# Patient Record
Sex: Female | Born: 1995 | Race: White | Hispanic: No | Marital: Single | Smoking: Never smoker
Health system: Southern US, Community
[De-identification: ages and names within clinical notes are randomized; demographics above are authoritative.]

## PROBLEM LIST (undated history)

## (undated) DIAGNOSIS — N926 Irregular menstruation, unspecified: Secondary | ICD-10-CM

## (undated) DIAGNOSIS — F419 Anxiety disorder, unspecified: Secondary | ICD-10-CM

## (undated) DIAGNOSIS — F329 Major depressive disorder, single episode, unspecified: Secondary | ICD-10-CM

## (undated) DIAGNOSIS — M5126 Other intervertebral disc displacement, lumbar region: Secondary | ICD-10-CM

## (undated) DIAGNOSIS — I10 Essential (primary) hypertension: Secondary | ICD-10-CM

## (undated) DIAGNOSIS — J45909 Unspecified asthma, uncomplicated: Secondary | ICD-10-CM

## (undated) DIAGNOSIS — E282 Polycystic ovarian syndrome: Secondary | ICD-10-CM

## (undated) HISTORY — DX: Anxiety disorder, unspecified: F41.9

## (undated) HISTORY — DX: Irregular menstruation, unspecified: N92.6

## (undated) HISTORY — DX: Essential (primary) hypertension: I10

## (undated) HISTORY — DX: Polycystic ovarian syndrome: E28.2

## (undated) HISTORY — DX: Major depressive disorder, single episode, unspecified: F32.9

## (undated) HISTORY — DX: Other intervertebral disc displacement, lumbar region: M51.26

## (undated) HISTORY — PX: TONSILLECTOMY AND ADENOIDECTOMY: SUR1326

## (undated) HISTORY — DX: Unspecified asthma, uncomplicated: J45.909

---

## 2017-06-22 DIAGNOSIS — I10 Essential (primary) hypertension: Secondary | ICD-10-CM

## 2017-06-22 DIAGNOSIS — R0789 Other chest pain: Secondary | ICD-10-CM | POA: Insufficient documentation

## 2017-06-22 DIAGNOSIS — R000 Tachycardia, unspecified: Secondary | ICD-10-CM

## 2017-06-22 HISTORY — DX: Other chest pain: R07.89

## 2017-06-22 HISTORY — DX: Essential (primary) hypertension: I10

## 2017-06-22 HISTORY — DX: Tachycardia, unspecified: R00.0

## 2017-06-27 ENCOUNTER — Ambulatory Visit: Payer: BLUE CROSS/BLUE SHIELD | Admitting: Cardiology

## 2017-06-27 ENCOUNTER — Encounter: Payer: Self-pay | Admitting: *Deleted

## 2017-06-27 ENCOUNTER — Encounter: Payer: Self-pay | Admitting: Cardiology

## 2017-06-27 DIAGNOSIS — R0789 Other chest pain: Secondary | ICD-10-CM | POA: Insufficient documentation

## 2017-06-27 DIAGNOSIS — E785 Hyperlipidemia, unspecified: Secondary | ICD-10-CM

## 2017-06-27 DIAGNOSIS — R002 Palpitations: Secondary | ICD-10-CM

## 2017-06-27 HISTORY — DX: Other chest pain: R07.89

## 2017-06-27 HISTORY — DX: Palpitations: R00.2

## 2017-06-27 HISTORY — DX: Hyperlipidemia, unspecified: E78.5

## 2017-06-27 NOTE — Progress Notes (Signed)
Cardiology Consultation:    Date:  06/27/2017   ID:  Tamara Alvarez, DOB Feb 14, 1996, MRN [id number]  PCP:  Shelbie Ammons, MD  Cardiologist:  Gypsy Balsam, MD   Referring MD: Shelbie Ammons, MD   Chief Complaint  Patient presents with  . New Patient (Initial Visit)    per RH   Palpitations  History of Present Illness:    Tamara Alvarez is a 22 y.o. female who is being seen today for the evaluation of palpitations at the request of Haque, Imran P, MD.  For many years she experienced palpitations that she describes a sensation of her heart speeding up it happens typically when she is standing up.  She tells me that onset and offset of the sensation is typically abrupt.  She eventually ended up going to the emergency room just few days ago when she started feeling lousy her heart started spitting up she check her blood pressure was slightly elevated her heart rate was about 140 she went to the emergency room nothing critical was fine in the emergency room she was given small dose of beta-blocker and she was sent home since that time she is feeling better seems like beta-blocker helping somewhat.  She said almost since she remember she got those episodes of her heart spitting up.  When she got the sensation she also described to have stabbing-like sensation in her chest also some shortness of breath but no sweating.  There is no dizziness no passing out.  She try to exercise on the regular basis does have a treadmill at home and try to use it a few times a week she can walk up to 20 minutes maybe half an hour with no difficulties.  She also has a dog that she likes and she walks with her with no difficulties.  She works in [employer] however she was getting those palpitations often while walking that she decided to stay away from work.  Past Medical History:  Diagnosis Date  . Asthma   . Hypertension 06/22/2017      Current Medications: Current Meds  Medication Sig  .  amoxicillin-clavulanate (AUGMENTIN) 875-125 MG tablet Take 1 tablet by mouth 2 (two) times daily.  Marland Kitchen etonogestrel (NEXPLANON) 68 MG IMPL implant 68 mg by Subdermal route once.  . loratadine (CLARITIN) 10 MG tablet Take 10 mg by mouth daily.  . metoprolol tartrate (LOPRESSOR) 25 MG tablet Take 25 mg by mouth 2 (two) times daily.   . Multiple Vitamin (MULTIVITAMIN) tablet Take 1 tablet by mouth daily.  . Tretinoin, Facial Wrinkles, (TRETINOIN, EMOLLIENT,) 0.05 % CREA Apply 1 application topically daily.     Allergies:   Hydrocodone; Other; and Doxycycline   Social History   Socioeconomic History  . Marital status: Unknown    Spouse name: Not on file  . Number of children: Not on file  . Years of education: Not on file  . Highest education level: Not on file  Occupational History  . Not on file  Social Needs  . Financial resource strain: Not on file  . Food insecurity:    Worry: Not on file    Inability: Not on file  . Transportation needs:    Medical: Not on file    Non-medical: Not on file  Tobacco Use  . Smoking status: Never Smoker  . Smokeless tobacco: Never Used  Substance and Sexual Activity  . Alcohol use: Yes    Comment: occasionally  . Drug use: Not  Currently  . Sexual activity: Not on file  Lifestyle  . Physical activity:    Days per week: Not on file    Minutes per session: Not on file  . Stress: Not on file  Relationships  . Social connections:    Talks on phone: Not on file    Gets together: Not on file    Attends religious service: Not on file    Active member of club or organization: Not on file    Attends meetings of clubs or organizations: Not on file    Relationship status: Not on file  Other Topics Concern  . Not on file  Social History Narrative  . Not on file     Family History: The patient's family history includes CAD in her maternal grandmother; Diabetes in her maternal grandmother; Heart Problems in her maternal uncle; Heart disease in  her paternal grandfather; Hypertension in her father. ROS:   Please see the history of present illness.    All 14 point review of systems negative except as described per history of present illness.  EKGs/Labs/Other Studies Reviewed:    The following studies were reviewed today: All laboratory tests reviewed from hospital all fine  EKG:  EKG is  ordered today.  The ekg ordered today demonstrates EKG done today in my office revealed normal sinus rhythm rate of 83 normal P interval normal QS complex duration morphology  Recent Labs: No results found for requested labs within last 8760 hours.  Recent Lipid Panel No results found for: CHOL, TRIG, HDL, CHOLHDL, VLDL, LDLCALC, LDLDIRECT  Physical Exam:    VS:  BP 112/90 (BP Location: Left Arm, Patient Position: Sitting, Cuff Size: Large)   Pulse 83   Ht  (1.727 m)   Wt 240 lb 12.8 oz (109.2 kg)   SpO2 99%   BMI 36.61 kg/m     Wt Readings from Last 3 Encounters:  06/27/17 240 lb 12.8 oz (109.2 kg)     GEN:  Well nourished, well developed in no acute distress HEENT: Normal NECK: No JVD; No carotid bruits LYMPHATICS: No lymphadenopathy CARDIAC: RRR, no murmurs, no rubs, no gallops RESPIRATORY:  Clear to auscultation without rales, wheezing or rhonchi  ABDOMEN: Soft, non-tender, non-distended MUSCULOSKELETAL:  No edema; No deformity  SKIN: Warm and dry NEUROLOGIC:  Alert and oriented x 3 PSYCHIATRIC:  Normal affect   ASSESSMENT:    1. Palpitations   2. Atypical chest pain   3. Dyslipidemia    PLAN:    In order of problems listed above:  1. Palpitations.  Some concerning features especially abrupt onset and abrupt offset.  I will ask her to wear event recorder to see exactly what kind of arrhythmia with dealing with.  Supportive evaluation I will do echocardiogram.  However honestly I doubt very much we will find some structural abnormality. 2. Atypical chest pain not related to exercise happening when she had  palpitations stabbing type.  I will not pursue any ischemia work-up at the moment however that something that will be reconsidered in the future if he truly finds somewhat arrhythmia. 3. Dyslipidemia: We will try to contact primary care physician to get her cholesterol to see if there is any need for medications.   Medication Adjustments/Labs and Tests Ordered: Current medicines are reviewed at length with the patient today.  Concerns regarding medicines are outlined above.  No orders of the defined types were placed in this encounter.  No orders of the defined types were  placed in this encounter.   Signed, Georgeanna Lea, MD, [facility]. 06/27/2017 11:23 AM    [facility]

## 2017-06-27 NOTE — Patient Instructions (Signed)
Medication Instructions:  Your physician recommends that you continue on your current medications as directed. Please refer to the Current Medication list given to you today.   Labwork: None  Testing/Procedures: You had an EKG today.   Your physician has requested that you have an echocardiogram. Echocardiography is a painless test that uses sound waves to create images of your heart. It provides your doctor with information about the size and shape of your heart and how well your heart's chambers and valves are working. This procedure takes approximately one hour. There are no restrictions for this procedure.  Your physician has recommended that you wear an event monitor. Event monitors are medical devices that record the heart's electrical activity. Doctors most often us these monitors to diagnose arrhythmias. Arrhythmias are problems with the speed or rhythm of the heartbeat. The monitor is a small, portable device. You can wear one while you do your normal daily activities. This is usually used to diagnose what is causing palpitations/syncope (passing out). Wear for 30 days.   Follow-Up: Your physician recommends that you schedule a follow-up appointment in: 6 weeks.  Any Other Special Instructions Will Be Listed Below (If Applicable).     If you need a refill on your cardiac medications before your next appointment, please call your pharmacy.   

## 2017-07-10 ENCOUNTER — Ambulatory Visit (HOSPITAL_BASED_OUTPATIENT_CLINIC_OR_DEPARTMENT_OTHER): Payer: BLUE CROSS/BLUE SHIELD | Attending: Cardiology

## 2017-07-16 ENCOUNTER — Encounter: Payer: Self-pay | Admitting: Cardiology

## 2017-08-20 ENCOUNTER — Ambulatory Visit: Payer: BLUE CROSS/BLUE SHIELD | Admitting: Cardiology

## 2020-05-03 ENCOUNTER — Other Ambulatory Visit (HOSPITAL_COMMUNITY): Payer: Self-pay | Admitting: Family Medicine

## 2020-05-03 ENCOUNTER — Inpatient Hospital Stay (HOSPITAL_COMMUNITY): Admission: RE | Admit: 2020-05-03 | Payer: BLUE CROSS/BLUE SHIELD | Source: Ambulatory Visit

## 2020-05-03 DIAGNOSIS — M7989 Other specified soft tissue disorders: Secondary | ICD-10-CM

## 2020-05-03 DIAGNOSIS — M79662 Pain in left lower leg: Secondary | ICD-10-CM

## 2020-05-05 ENCOUNTER — Inpatient Hospital Stay (HOSPITAL_COMMUNITY): Admission: RE | Admit: 2020-05-05 | Payer: Medicaid Other | Source: Ambulatory Visit

## 2020-08-04 ENCOUNTER — Telehealth: Payer: Self-pay | Admitting: Internal Medicine

## 2020-08-04 NOTE — Telephone Encounter (Signed)
Dawn from [facility] called into [facility] to inquire about a fax sent previously on 07/13/2020 in regards to patient. Dawn was trying to have patient seen by Dr. Earlene Plater because patient is trying to conceive and also wants to be checked for PCOS. Patient has [employer] and needs OBGYN care. Connected with case manager and she advised to send to Dr. Earlene Plater since fax was previously sent to her and advise for where she recommends patient to receive care from. Please follow up if appropriate.

## 2020-08-09 NOTE — Telephone Encounter (Signed)
Spoke to [employer] at [facility]. Informed that Dr. Earlene Plater is no longer at our office. She will see if she can send patient to another provider for PCOS.

## 2021-08-09 ENCOUNTER — Emergency Department (HOSPITAL_COMMUNITY)
Admission: EM | Admit: 2021-08-09 | Discharge: 2021-08-09 | Disposition: A | Discharge status: Home / Self Care | Payer: Commercial Managed Care - HMO | Attending: Emergency Medicine | Admitting: Emergency Medicine

## 2021-08-09 ENCOUNTER — Encounter (HOSPITAL_COMMUNITY): Payer: Self-pay | Admitting: Emergency Medicine

## 2021-08-09 ENCOUNTER — Other Ambulatory Visit: Payer: Self-pay

## 2021-08-09 ENCOUNTER — Emergency Department (HOSPITAL_COMMUNITY): Payer: Commercial Managed Care - HMO

## 2021-08-09 DIAGNOSIS — R1012 Left upper quadrant pain: Secondary | ICD-10-CM | POA: Diagnosis present

## 2021-08-09 DIAGNOSIS — N12 Tubulo-interstitial nephritis, not specified as acute or chronic: Secondary | ICD-10-CM | POA: Diagnosis not present

## 2021-08-09 DIAGNOSIS — Z79899 Other long term (current) drug therapy: Secondary | ICD-10-CM | POA: Insufficient documentation

## 2021-08-09 LAB — URINALYSIS, ROUTINE W REFLEX MICROSCOPIC
Bilirubin Urine: NEGATIVE
Glucose, UA: NEGATIVE mg/dL
Ketones, ur: NEGATIVE mg/dL
Nitrite: NEGATIVE
Protein, ur: NEGATIVE mg/dL
Specific Gravity, Urine: 1.024 (ref 1.005–1.030)
pH: 5 (ref 5.0–8.0)

## 2021-08-09 LAB — CBC
HCT: 42.1 % (ref 36.0–46.0)
Hemoglobin: 14.2 g/dL (ref 12.0–15.0)
MCH: 29.8 pg (ref 26.0–34.0)
MCHC: 33.7 g/dL (ref 30.0–36.0)
MCV: 88.3 fL (ref 80.0–100.0)
Platelets: 306 10*3/uL (ref 150–400)
RBC: 4.77 MIL/uL (ref 3.87–5.11)
RDW: 12.1 % (ref 11.5–15.5)
WBC: 7.7 10*3/uL (ref 4.0–10.5)
nRBC: 0 % (ref 0.0–0.2)

## 2021-08-09 LAB — BASIC METABOLIC PANEL
Anion gap: 8 (ref 5–15)
BUN: 10 mg/dL (ref 6–20)
CO2: 26 mmol/L (ref 22–32)
Calcium: 9.4 mg/dL (ref 8.9–10.3)
Chloride: 104 mmol/L (ref 98–111)
Creatinine, Ser: 0.96 mg/dL (ref 0.44–1.00)
GFR, Estimated: >60 mL/min (ref >60)
Glucose, Bld: 95 mg/dL (ref 70–99)
Potassium: 3.6 mmol/L (ref 3.5–5.1)
Sodium: 138 mmol/L (ref 135–145)

## 2021-08-09 LAB — I-STAT BETA HCG BLOOD, ED (MC, WL, AP ONLY): I-stat hCG, quantitative: <5 m[IU]/mL (ref <5)

## 2021-08-09 MED ORDER — SULFAMETHOXAZOLE-TRIMETHOPRIM 800-160 MG PO TABS: 1.0000 | ORAL_TABLET | Freq: Two times a day (BID) | ORAL | 0 refills | Status: AC

## 2021-08-09 MED ORDER — IBUPROFEN 800 MG PO TABS
800.0000 mg | ORAL_TABLET | Freq: Three times a day (TID) | ORAL | 0 refills | Status: DC
Start: 2021-08-09 — End: 2023-03-23

## 2021-08-09 MED ORDER — ONDANSETRON 4 MG PO TBDP: 4.0000 mg | ORAL_TABLET | ORAL | 0 refills | Status: DC | PRN

## 2021-08-09 NOTE — ED Triage Notes (Signed)
Pt states she has been having left flank pain for a few days and dark urine. Denies any pain with urination. Pain with percussion over left flank.

## 2021-08-09 NOTE — ED Provider Triage Note (Signed)
Emergency Medicine Provider Triage Evaluation Note  Tamara Alvarez , a 26 y.o. female  was evaluated in triage.  Pt complains of left-sided flank pain.  Has been going on for 4 to 5 days.  Over the last 24 hours it is acutely worsened.  No dysuria or hematuria but does say her urine has been dark.  No difficulty with urinating.  Family history of kidney stones however no personal history.  Last menstrual period was mid May, believes she could be pregnant.  No history of ectopic, cysts or torsion.  Review of Systems  Positive: Flank pain Negative:   Physical Exam  BP (!) 150/101 (BP Location: Right Arm)   Pulse 87   Temp 98.7 F (37.1 C) (Oral)   Resp 16   LMP 07/13/2021   SpO2 96%  Gen:   Awake, no distress   Resp:  Normal effort  MSK:   Moves extremities without difficulty  Other:  Positive left-sided CVA tenderness.  Nontender abdomen.  Ambulatory and appears to be resting reasonably comfortably in triage.  Low suspicion infected or obstructed stone  Medical Decision Making  Medically screening exam initiated at 9:57 AM.  Appropriate orders placed.  Kymberlie Brazeau was informed that the remainder of the evaluation will be completed by another provider, this initial triage assessment does not replace that evaluation, and the importance of remaining in the ED until their evaluation is complete.    Labs and patient will get CT scan as she is not pregnant.   Saddie Benders, [place] 08/09/21 [phone]

## 2021-08-09 NOTE — Discharge Instructions (Signed)
1.  Your pain appears to be due to a kidney infection.  Urine culture has been done to help identify the type of bacteria.  You have been prescribed Bactrim to take as an antibiotic take this twice daily as prescribed.  If you need a change of antibiotics once your urine culture has been completed, you will be called.  Urine culture typically takes 1 to 2 days to complete. 2.  For pain take ibuprofen 800 mg every 8 hours as needed.  If you need additional pain control take extra strength Tylenol every 6 hours with the ibuprofen.  You have been prescribed Zofran to take for nausea.  Take this if needed.  Try to stay hydrated drinking plenty of fluids. 3.  Return to the emergency department if you are developing high fevers, worsening pain, vomiting or other concerning symptoms.  See your family doctor for recheck within the next 3 to 5 days.

## 2021-08-09 NOTE — ED Provider Notes (Signed)
[facility]   CSN: [id number] Arrival date & time: 08/09/21  0948     History  Chief Complaint  Patient presents with   Flank Pain    Tamara Alvarez is a 26 y.o. female.  HPI Patient reports she started getting left flank pain about 3 days ago.  It has progressively worsened.  Is both sharp and pressure quality.  Patient indicates the pain is in her flank and wraps around to her upper and mid abdomen.  She reports she thinks she is getting some fever at nighttime.  She reports she has had nausea and a couple episodes of vomiting.  Patient denies any similar episodes of pain.  She reports her urine has been somewhat dark but not a lot of burning urgency.  She has had a few urinary tract infections in the past but not frequently.  Patient denies any abnormal vaginal bleeding or discharge.  No history of similar pain.  No history of personal kidney stone.  Patient denies she has had any cough or shortness of breath or respiratory symptoms    Home Medications Prior to Admission medications   Medication Sig Start Date End Date Taking? Authorizing Provider  ibuprofen (ADVIL) 800 MG tablet Take 1 tablet (800 mg total) by mouth 3 (three) times daily. 08/09/21  Yes Arby BarrettePfeiffer, Slayton Lubitz, MD  ondansetron (ZOFRAN-ODT) 4 MG disintegrating tablet Take 1 tablet (4 mg total) by mouth every 4 (four) hours as needed for nausea or vomiting. 08/09/21  Yes Arby BarrettePfeiffer, Meiling Hendriks, MD  sulfamethoxazole-trimethoprim (BACTRIM DS) 800-160 MG tablet Take 1 tablet by mouth 2 (two) times daily for 14 days. 08/09/21 08/23/21 Yes Arby BarrettePfeiffer, Merissa Renwick, MD  amoxicillin-clavulanate (AUGMENTIN) 875-125 MG tablet Take 1 tablet by mouth 2 (two) times daily.    [provider]  etonogestrel (NEXPLANON) 68 MG IMPL implant 68 mg by Subdermal route once.    [provider]  hydrOXYzine (VISTARIL) 25 MG capsule Take 25 mg by mouth 3 (three) times daily as needed for anxiety.  03/16/21   [provider]  loratadine (CLARITIN) 10 MG tablet Take 10 mg by mouth daily.    [provider]  metoprolol tartrate (LOPRESSOR) 25 MG tablet Take 25 mg by mouth 2 (two) times daily.     [provider]  Multiple Vitamin (MULTIVITAMIN) tablet Take 1 tablet by mouth daily.    [provider]  Tretinoin, Facial Wrinkles, (TRETINOIN, EMOLLIENT,) 0.05 % CREA Apply 1 application topically daily.    [provider]      Allergies    Hydrocodone, Other, and Doxycycline    Review of Systems   Review of Systems 10 systems reviewed negative except as per HPI Physical Exam Updated Vital Signs BP 114/81 (BP Location: Right Arm)   Pulse 69   Temp 98.5 F (36.9 C) (Oral)   Resp 17   LMP 07/13/2021   SpO2 100%  Physical Exam Constitutional:      Comments: Alert nontoxic no respiratory distress.  HENT:     Mouth/Throat:     Pharynx: Oropharynx is clear.  Eyes:     Extraocular Movements: Extraocular movements intact.  Cardiovascular:     Rate and Rhythm: Normal rate and regular rhythm.  Pulmonary:     Effort: Pulmonary effort is normal.     Breath sounds: Normal breath sounds.  Abdominal:     Comments: Moderate left upper quadrant tenderness to palpation without guarding or appreciable mass.  Lower abdomen is nontender.  Positive for left CVA tenderness to percussion.  Musculoskeletal:        General: No swelling or tenderness. Normal range of motion.     Right lower leg: No edema.     Left lower leg: No edema.  Skin:    General: Skin is warm and dry.  Neurological:     General: No focal deficit present.     Mental Status: She is oriented to person, place, and time.     Motor: No weakness.     Coordination: Coordination normal.     ED Results / Procedures / Treatments   Labs (all labs ordered are listed, but only abnormal results are displayed) Labs Reviewed  URINALYSIS, ROUTINE W REFLEX MICROSCOPIC - Abnormal; Notable for  the following components:      Result Value   APPearance HAZY (*)    Hgb urine dipstick SMALL (*)    Leukocytes,Ua MODERATE (*)    Bacteria, UA RARE (*)    All other components within normal limits  URINE CULTURE  BASIC METABOLIC PANEL  CBC  I-STAT BETA HCG BLOOD, ED ([facility], [facility], [facility] ONLY)    EKG None  Radiology CT Renal Stone Study  Result Date: 08/09/2021 CLINICAL DATA:  Flank pain, kidney stone suspected EXAM: CT ABDOMEN AND PELVIS WITHOUT CONTRAST TECHNIQUE: Multidetector CT imaging of the abdomen and pelvis was performed following the standard protocol without IV contrast. RADIATION DOSE REDUCTION: This exam was performed according to the departmental dose-optimization program which includes automated exposure control, adjustment of the mA and/or kV according to patient size and/or use of iterative reconstruction technique. COMPARISON:  February 10, 2021 FINDINGS: Lower chest: No acute abnormality. Hepatobiliary: No focal liver abnormality is seen. No gallstones, gallbladder wall thickening, or biliary dilatation. Pancreas: Unremarkable. No pancreatic ductal dilatation or surrounding inflammatory changes. Spleen: Normal in size without focal abnormality. Adrenals/Urinary Tract: Adrenal glands are unremarkable. Kidneys are normal, without renal calculi, focal lesion, or hydronephrosis. Bladder is unremarkable. Stomach/Bowel: Stomach is within normal limits. Appendix appears normal. No evidence of bowel wall thickening, distention, or inflammatory changes. Vascular/Lymphatic: No significant vascular findings are present. No enlarged abdominal or pelvic lymph nodes. Reproductive: Uterus and bilateral adnexa are unremarkable. Other: No abdominal wall hernia or abnormality. No abdominopelvic ascites. Musculoskeletal: No acute or significant osseous findings. IMPRESSION: Unremarkable noncontrast CT of the abdomen pelvis. No evidence of renal calculi. Electronically Signed   By: Marjo Bicker M.D.   On:  08/09/2021 11:28    Procedures Procedures    Medications Ordered in ED Medications - No data to display  ED Course/ Medical Decision Making/ A&P                           Medical Decision Making  Patient presents with worsening left flank pain.  She is otherwise healthy.  Subjective fever but none currently.  Vital signs are currently stable.  Patient's pain is reproducible in the left upper abdomen but no reproducible pain in the lower quadrant.  Triage orders already in place for urinalysis, lab work and CT scan.  Review of CT scan is interpreted by radiology is for no acute findings.  Review of urinalysis shows white blood cells 11-20 as well as red blood cells 6-10 and squamous epithelial cells 6-10.  Normal CBC and metabolic panel.  At this time with significantly reproducible left flank pain and upper abdominal pain combined with urinalysis with red blood cells and white blood cells, clinical  picture is consistent with pyelonephritis.  At this time, with patient having strong clinical picture of her pyelonephritis although no inflammatory change on CT scan, will proceed with outpatient antibiotics.  Patient is well-hydrated with stable vital signs and no immediate risk factors for complicated pyelonephritis.  Plan will be to treat with Bactrim, Zofran, ibuprofen and Tylenol as needed.  Discharge instructions include careful return precautions.        Final Clinical Impression(s) / ED Diagnoses Final diagnoses:  Pyelonephritis    Rx / DC Orders ED Discharge Orders          Ordered    sulfamethoxazole-trimethoprim (BACTRIM DS) 800-160 MG tablet  2 times daily        08/09/21 1640    ibuprofen (ADVIL) 800 MG tablet  3 times daily        08/09/21 1640    ondansetron (ZOFRAN-ODT) 4 MG disintegrating tablet  Every 4 hours PRN        08/09/21 1640              Arby Barrette, MD 08/09/21 1649

## 2021-08-10 LAB — URINE CULTURE: Culture: NO GROWTH

## 2022-08-03 NOTE — Progress Notes (Deleted)
REFERRING PROVIDER: Lars Mage, NP [address] [place],  [place] [address]   PRIMARY PROVIDER:  Galvin Proffer, MD  PRIMARY REASON FOR VISIT:  No diagnosis found.   HISTORY OF PRESENT ILLNESS:   Tamara Alvarez, a 27 y.o. female, was seen for a  cancer genetics consultation at the request of Deberah Castle, FNP due to a family history of breast, ovarian, and colon cancer.  Tamara Alvarez presents to clinic today to discuss the possibility of a hereditary predisposition to cancer, to discuss genetic testing, and to further clarify her future cancer risks, as well as potential cancer risks for family members.    Tamara Alvarez is a 27 y.o. female with no personal history of cancer.  ***  CANCER HISTORY:  Oncology History   No history exists.     RISK FACTORS:  Mammogram within the last year: *** Number of breast biopsies: {Numbers 1-12 multi-select:20307}. Colonoscopy: {Yes/No-Ex:120004}; {normal/abnormal/not examined:14677}. Hysterectomy: {Yes/No-Ex:120004}.  Ovaries intact: {Yes/No-Ex:120004}.  Up to date with pelvic exams: {Yes/No-Ex:120004}. Menarche was at age ***.  First live birth at age ***.  Menopausal status: {Menopause:31378}.  OCP use for approximately {Numbers 1-12 multi-select:20307} years.  HRT use: {Numbers 1-12 multi-select:20307} years. Dermatology screening: ***  Past Medical History:  Diagnosis Date   Asthma    Hypertension 06/22/2017    Past Surgical History:  Procedure Laterality Date   TONSILLECTOMY AND ADENOIDECTOMY      Social History   Socioeconomic History   Marital status: Single    Spouse name: Not on file   Number of children: Not on file   Years of education: Not on file   Highest education level: Not on file  Occupational History   Not on file  Tobacco Use   Smoking status: Never   Smokeless tobacco: Never  Vaping Use   Vaping Use: Never used  Substance and Sexual Activity   Alcohol use: Yes    Comment: occasionally    Drug use: Not Currently   Sexual activity: Not on file  Other Topics Concern   Not on file  Social History Narrative   Not on file   Social Determinants of Health   Financial Resource Strain: Not on file  Food Insecurity: Not on file  Transportation Needs: Not on file  Physical Activity: Not on file  Stress: Not on file  Social Connections: Not on file     FAMILY HISTORY:  We obtained a detailed, 4-generation family history.  Significant diagnoses are listed below: Family History  Problem Relation Age of Onset   COPD Mother    Hypertension Father    Heart disease Father    CAD Maternal Grandmother    Diabetes Maternal Grandmother    Colon cancer Maternal Grandmother    Ovarian cancer Maternal Grandmother    Lung cancer Maternal Grandfather    Breast cancer Paternal Grandmother    Diabetes Paternal Grandmother    Heart disease Paternal Grandfather    Heart Problems Maternal Uncle    Spina bifida Other     Tamara Alvarez is {aware/unaware} of previous family history of genetic testing for hereditary cancer risks. Patient's maternal ancestors are of *** descent, and paternal ancestors are of *** descent. There {IS NO:12509} reported Ashkenazi Jewish ancestry. There {IS NO:12509} known consanguinity.  GENETIC COUNSELING ASSESSMENT: Tamara Alvarez is a 27 y.o. female with a {Personal/family:20331} history of {cancer/polyps} which is somewhat suggestive of a {DISEASE} and predisposition to cancer given ***. We, therefore, discussed and recommended  the following at today's visit.   DISCUSSION: We discussed that *** - ***% of *** is hereditary, with most cases of hereditary *** cancer associated with ***.  There are other genes that can be associated with hereditary *** cancer syndromes.  These include ***.  We discussed that testing is beneficial for several reasons, including knowing about other cancer risks, identifying potential screening and risk-reduction options that may be  appropriate, and to understanding if other family members could be at risk for cancer and allowing them to undergo genetic testing.  We reviewed the characteristics, features and inheritance patterns of hereditary cancer syndromes. We also discussed genetic testing, including the appropriate family members to test, the process of testing, insurance coverage and turn-around-time for results. We discussed the implications of a negative, positive, and variant of uncertain significant result. ***We discussed that negative results would be uninformative given that Tamara Alvarez does not have a personal history of cancer. We recommended Tamara Alvarez pursue genetic testing for a panel that contains genes associated with ***.  Tamara Alvarez was offered a common hereditary cancer panel (48 genes) and an expanded pan-cancer panel (85 genes). Tamara Alvarez was informed of the benefits and limitations of each panel, including that expanded pan-cancer panels contain several genes that do not have clear management guidelines at this point in time.  We also discussed that as the number of genes included on a panel increases, the chances of variants of uncertain significance increases.  After considering the benefits and limitations of each gene panel, Tamara Alvarez elected to have an *** through ***.   Based on Tamara Alvarez's {Personal/family:20331} history of cancer, she meets medical criteria for genetic testing. Despite that she meets criteria, she may still have an out of pocket cost. We discussed that if her out of pocket cost for testing is over $100, the laboratory should contact them to discuss self-pay options and/or patient pay assistance programs.   ***We reviewed the characteristics, features and inheritance patterns of hereditary cancer syndromes. We also discussed genetic testing, including the appropriate family members to test, the process of testing, insurance coverage and turn-around-time for results. We  discussed the implications of a negative, positive and/or variant of uncertain significant result. In order to get genetic test results in a timely manner so that Tamara Alvarez can use these genetic test results for surgical decisions, we recommended Tamara Alvarez pursue genetic testing for the ***. Once complete, we recommend Tamara Alvarez pursue reflex genetic testing to the *** gene panel.   Based on Tamara Alvarez's {Personal/family:20331} history of cancer, she meets medical criteria for genetic testing. Despite that she meets criteria, she may still have an out of pocket cost.   ***We discussed with Tamara Alvarez that the {Personal/family:20331} history does not meet insurance or NCCN criteria for genetic testing and, therefore, is not highly consistent with a familial hereditary cancer syndrome.  We feel she is at low risk to harbor a gene mutation associated with such a condition. Thus, we did not recommend any genetic testing, at this time, and recommended Tamara Alvarez continue to follow the cancer screening guidelines given by her primary healthcare provider.  ***In order to estimate her chance of having a {CA GENE:62345} mutation, we used statistical models ({GENMODELS:62370}) that consider her personal medical history, family history and ancestry.  Because each model is different, there can be a lot of variability in the risks they give.  Therefore, these numbers must be considered a rough range and not a  precise risk of having a {CA GENE:62345} mutation.  These models estimate that she has approximately a ***-***% chance of having a mutation. Based on this assessment of her family and personal history, genetic testing {IS/ISNOT:34056} recommended.  ***Based on the patient's {Personal/family:20331} history, a statistical model ({GENMODELS:62370}) was used to estimate her risk of developing {CA HX:54794}. This estimates her lifetime risk of developing {CA HX:54794} to be approximately ***%. This  estimation does not consider any genetic testing results.  The patient's lifetime breast cancer risk is a preliminary estimate based on available information using one of several models endorsed by the American Cancer Society (ACS). The ACS recommends consideration of breast MRI screening as an adjunct to mammography for patients at high risk (defined as 20% or greater lifetime risk).   ***Tamara Alvarez has been determined to be at high risk for breast cancer.  Therefore, we recommend that annual screening with mammography and breast MRI be performed.  ***begin at age 4, or 10 years prior to the age of breast cancer diagnosis in a relative (whichever is earlier).  We discussed that Tamara Alvarez should discuss her individual situation with her referring physician and determine a breast cancer screening plan with which they are both comfortable.    We discussed the Genetic Information Non-Discrimination Act (GINA) of 2008, which helps protect individuals against genetic discrimination based on their genetic test results.  It impacts both health insurance and employment.  With health insurance, it protects against genetic test results being used for increased premiums or policy termination. For employment, it protects against hiring, firing and promoting decisions based on genetic test results.  GINA does not apply to those in the [employer], those who work for companies with less than 15 employees, and new life insurance or long-term disability insurance policies.  Health status due to a cancer diagnosis is not protected under GINA.  PLAN: After considering the risks, benefits, and limitations, Ms. Houfek provided informed consent to pursue genetic testing and the blood sample was sent to {Lab} Laboratories for analysis of the {test}. Results should be available within approximately {TAT TIME} weeks' time, at which point they will be disclosed by telephone to Ms. Vandenakker, as will any additional recommendations  warranted by these results. Ms. Exum will receive a summary of her genetic counseling visit and a copy of her results once available. This information will also be available in Epic.   *** Despite our recommendation, Ms. Fury did not wish to pursue genetic testing at today's visit. We understand this decision and remain available to coordinate genetic testing at any time in the future. We, therefore, recommend Ms. Macia continue to follow the cancer screening guidelines given by her primary healthcare provider.  ***Based on Ms. Bair's family history, we recommended her ***, who was diagnosed with *** at age ***, have genetic counseling and testing. Ms. Cann will let us know if we can be of any assistance in coordinating genetic counseling and/or testing for this family member.   Lastly, we encouraged Ms. Orians to remain in contact with cancer genetics annually so that we can continuously update the family history and inform her of any changes in cancer genetics and testing that may be of benefit for this family.   Ms. Honda's questions were answered to her satisfaction today. Our contact information was provided should additional questions or concerns arise. ***Thank you for the referral and allowing [place] to share in the care of your patient.   Gian Ybarra M. Rennie Plowman, MS, LCGC  Education officer, [email or web address] (P) [phone]   The patient was seen for a total of *** minutes in face-to-face genetic counseling.  ***The was patient was accompanied by ***.  ***The patient was seen alone.  Drs. Pamelia Hoit and/or Mosetta Putt were available to discuss this case as needed.  _______________________________________________________________________ For Office Staff:  Number of people involved in session: *** Was an Intern/ student involved with case: {YES/NO:63}

## 2022-08-04 ENCOUNTER — Inpatient Hospital Stay: Payer: Medicaid Other

## 2022-08-04 ENCOUNTER — Inpatient Hospital Stay: Payer: Medicaid Other | Attending: Genetic Counselor | Admitting: Genetic Counselor

## 2023-03-22 ENCOUNTER — Encounter: Payer: Self-pay | Admitting: Cardiology

## 2023-03-22 DIAGNOSIS — J45909 Unspecified asthma, uncomplicated: Secondary | ICD-10-CM | POA: Insufficient documentation

## 2023-03-23 ENCOUNTER — Encounter: Payer: Self-pay | Admitting: Cardiology

## 2023-03-25 NOTE — Progress Notes (Unsigned)
Cardiology Office Note:    Date:  03/26/2023   ID:  Tamara Alvarez, DOB 05/05/1995, MRN [id number]  PCP:  Galvin Proffer, MD  Cardiologist:  Norman Herrlich, MD   Referring MD: Marcellus Scott, MD  ASSESSMENT:    1. Pericardial effusion   2. Morbid obesity (HCC)   3. PCOS (polycystic ovarian syndrome)    PLAN:    In order of problems listed above:  As I discussed with her is not uncommon we need to do a CT scan for 1 indication that you find another incidental abnormality I am unsure what the significance of a small pericardial effusion abdominal pelvic CT scan Will go ahead and set up an echocardiogram I can quantitate this we can look at the nature of the pericardium and decide whether this is a clinical problem. I will draw thyroid studies as not uncommonly hypothyroidism is associated with pericardial effusion If echocardiogram is unremarkable follow-up in my office as needed Strongly encouraged to continue semaglutide which I think is very helpful with obesity PCOS is on appropriate medications and doing well  Next appointment as needed   Medication Adjustments/Labs and Tests Ordered: Current medicines are reviewed at length with the patient today.  Concerns regarding medicines are outlined above.  Orders Placed This Encounter  Procedures   TSH+T4F+T3Free   EKG 12-Lead   ECHOCARDIOGRAM COMPLETE   No orders of the defined types were placed in this encounter.      History of Present Illness:    Tamara Alvarez is a 28 y.o. female who is being seen today for the evaluation of small pericardial effusion noted on CT of the abdomen and pelvis.  At the request of Drusilla Kanner, NP  She was previously seen by my partner Dr. Bing Matter May 2019 with palpitation EKG was normal no findings of arrhythmogenic cardiomyopathy preexcitation long QT syndrome or Brugada.    She was seen [facility] health ED 06/30/2019 she had an EKG performed there which showed the presence  bundle-branch block.  Chest x-ray was interpreted as normal.  She wa had s seen with her PCP 03/21/2023 problem list included obesity with semaglutide therapy and PCOS.  Medications included metformin spironolactone.  Oratory studies included CMP sodium 141 potassium 3.8 creatinine 0.78 GFR 107 cc/min liver function test were normal hemoglobin and platelets normal 14.8 and 312,000.  CT of the abdomen and pelvis were performed unremarkable except for a notation of a small pericardial effusion.  She had a CT of the chest performed at [facility] in April 2019 at that time the cardiovascular structures were normal and there was no pericardial fluid noted.  She is employed as a Pension scheme manager for [employer] She has good healthcare knowledge and tells me she has never had heart disease congenital rheumatic or atrial fibrillation She has no known history of thyroid disease but she tells me she has not had any testing recently She has had no symptoms to suggest pericardial disease she is not having edema chest pain no recent respiratory infection or pleurisy. Current medications consist of semaglutide for weight loss has been in metformin and spironolactone with her PCOS Past Medical History:  Diagnosis Date   Anxiety disorder    Asthma    Atypical chest pain 06/27/2017   Chest wall pain 06/22/2017   Dyslipidemia 06/27/2017   Hypertension 06/22/2017   Irregular menstruation    Major depressive disorder, single episode, unspecified    Other intervertebral disc displacement, lumbar region  Palpitations 06/27/2017   Polycystic ovarian syndrome    Tachycardia 06/22/2017    Past Surgical History:  Procedure Laterality Date   TONSILLECTOMY AND ADENOIDECTOMY      Current Medications: Current Meds  Medication Sig   Collagen-Vitamin C-Biotin (COLLAGEN PO) Take 2 tablets by mouth daily.   FIBER GUMMIES PO Take 1 tablet by mouth 2 (two) times daily.   ibuprofen (ADVIL) 800  MG tablet Take 800 mg by mouth 2 (two) times daily as needed for mild pain (pain score 1-3) or moderate pain (pain score 4-6).   metFORMIN (GLUCOPHAGE) 500 MG tablet Take 500 mg by mouth daily with breakfast.   methocarbamol (ROBAXIN) 500 MG tablet Take 500 mg by mouth 2 (two) times daily.   Multiple Vitamin (MULTIVITAMIN) tablet Take 1 tablet by mouth daily.   Multiple Vitamins-Minerals (HAIR SKIN AND NAILS FORMULA PO) Take 1 tablet by mouth daily.   mupirocin ointment (BACTROBAN) 2 % Apply 1 Application topically 2 (two) times daily.   ondansetron (ZOFRAN) 4 MG tablet Take 4 mg by mouth every 8 (eight) hours as needed for nausea or vomiting.   Semaglutide-Weight Management 0.5 MG/0.5ML SOAJ Inject 0.5 mg into the skin once a week.   spironolactone (ALDACTONE) 50 MG tablet Take 50 mg by mouth daily.   [DISCONTINUED] buPROPion (WELLBUTRIN XL) 150 MG 24 hr tablet Take 150 mg by mouth daily.   [DISCONTINUED] hydrOXYzine (ATARAX) 25 MG tablet Take 25 mg by mouth as needed.   [DISCONTINUED] tiZANidine (ZANAFLEX) 4 MG tablet Take 4 mg by mouth every 6 (six) hours as needed.     Allergies:   Hydrocodone, Other, Tomato, and Doxycycline   Social History   Socioeconomic History   Marital status: Single    Spouse name: Not on file   Number of children: Not on file   Years of education: Not on file   Highest education level: Not on file  Occupational History   Not on file  Tobacco Use   Smoking status: Never   Smokeless tobacco: Never  Vaping Use   Vaping status: Never Used  Substance and Sexual Activity   Alcohol use: Yes    Comment: occasionally   Drug use: Not Currently   Sexual activity: Not on file  Other Topics Concern   Not on file  Social History Narrative   Not on file   Social Drivers of Health   Financial Resource Strain: Not on file  Food Insecurity: Not on file  Transportation Needs: Not on file  Physical Activity: Not on file  Stress: Not on file  Social  Connections: Not on file     Family History: The patient's family history includes Breast cancer in her paternal grandmother; CAD in her maternal grandmother; COPD in her mother; Colon cancer in her maternal grandmother; Diabetes in her maternal grandmother and paternal grandmother; Heart Problems in her maternal uncle; Heart disease in her father and paternal grandfather; Hypertension in her father; Lung cancer in her maternal grandfather; Ovarian cancer in her maternal grandmother; Spina bifida in her sister and another family member.  ROS:   ROS Please see the history of present illness.     All other systems reviewed and are negative.  EKGs/Labs/Other Studies Reviewed:    The following studies were reviewed today:  EKG Interpretation Date/Time:  Monday March 26 2023 15:58:38 EST Ventricular Rate:  107 PR Interval:  132 QRS Duration:  92 QT Interval:  344 QTC Calculation: 459 R Axis:   39  Text Interpretation: Sinus tachycardia RSR' pattern in V1 Otherwise normal ECG No previous ECGs available Confirmed by Norman Herrlich ([id number]) on 03/26/2023 4:54:33 PM    Physical Exam:    VS:  BP (!) 130/90 (BP Location: Left Arm, Patient Position: Sitting, Cuff Size: Normal)   Pulse 98   Ht 5\' 9"  (1.753 m)   Wt 245 lb (111.1 kg)   SpO2 99%   BMI 36.18 kg/m     Wt Readings from Last 3 Encounters:  03/26/23 245 lb (111.1 kg)  03/20/23 244 lb (110.7 kg)  06/27/17 240 lb 12.8 oz (109.2 kg)     GEN:  Well nourished, well developed in no acute distress in particular she has no neck vein distention or edema HEENT: Normal NECK: No JVD; No carotid bruits LYMPHATICS: No lymphadenopathy CARDIAC: RRR, no murmurs, rubs, gallops RESPIRATORY:  Clear to auscultation without rales, wheezing or rhonchi  ABDOMEN: Soft, non-tender, non-distended MUSCULOSKELETAL:  No edema; No deformity  SKIN: Warm and dry NEUROLOGIC:  Alert and oriented x 3 PSYCHIATRIC:  Normal affect     Signed, Norman Herrlich, MD  03/26/2023 4:57 PM    [facility]

## 2023-03-26 ENCOUNTER — Ambulatory Visit: Payer: 59 | Attending: Cardiology | Admitting: Cardiology

## 2023-03-26 ENCOUNTER — Encounter: Payer: Self-pay | Admitting: Cardiology

## 2023-03-26 VITALS — BP 130/90 | HR 98 | Ht 69.0 in | Wt 245.0 lb

## 2023-03-26 DIAGNOSIS — J45909 Unspecified asthma, uncomplicated: Secondary | ICD-10-CM

## 2023-03-26 DIAGNOSIS — I3139 Other pericardial effusion (noninflammatory): Secondary | ICD-10-CM

## 2023-03-26 DIAGNOSIS — E282 Polycystic ovarian syndrome: Secondary | ICD-10-CM | POA: Diagnosis not present

## 2023-03-26 NOTE — Patient Instructions (Signed)
Medication Instructions:  Your physician recommends that you continue on your current medications as directed. Please refer to the Current Medication list given to you today.  *If you need a refill on your cardiac medications before your next appointment, please call your pharmacy*   Lab Work: Your physician recommends that you return for lab work in:   Labs today: TSH T3 T4  If you have labs (blood work) drawn today and your tests are completely normal, you will receive your results only by: MyChart Message (if you have MyChart) OR A paper copy in the mail If you have any lab test that is abnormal or we need to change your treatment, we will call you to review the results.   Testing/Procedures: Your physician has requested that you have an echocardiogram. Echocardiography is a painless test that uses sound waves to create images of your heart. It provides your doctor with information about the size and shape of your heart and how well your heart's chambers and valves are working. This procedure takes approximately one hour. There are no restrictions for this procedure. Please do NOT wear cologne, perfume, aftershave, or lotions (deodorant is allowed). Please arrive 15 minutes prior to your appointment time.  Please note: We ask at that you not bring children with you during ultrasound (echo/ vascular) testing. Due to room size and safety concerns, children are not allowed in the ultrasound rooms during exams. Our front office staff cannot provide observation of children in our lobby area while testing is being conducted. An adult accompanying a patient to their appointment will only be allowed in the ultrasound room at the discretion of the ultrasound technician under special circumstances. We apologize for any inconvenience.    Follow-Up: At [facility], you and your health needs are our priority.  As part of our continuing mission to provide you with exceptional heart care, we  have created designated Provider Care Teams.  These Care Teams include your primary Cardiologist (physician) and Advanced Practice Providers (APPs -  Physician Assistants and Nurse Practitioners) who all work together to provide you with the care you need, when you need it.  We recommend signing up for the patient portal called "MyChart".  Sign up information is provided on this After Visit Summary.  MyChart is used to connect with patients for Virtual Visits (Telemedicine).  Patients are able to view lab/test results, encounter notes, upcoming appointments, etc.  Non-urgent messages can be sent to your provider as well.   To learn more about what you can do with MyChart, go to [email or web address].    Your next appointment:   Follow up as needed  Provider:   Norman Herrlich, MD    Other Instructions None

## 2023-03-27 LAB — TSH+T4F+T3FREE
Free T4: 1.29 ng/dL (ref 0.82–1.77)
T3, Free: 3.5 pg/mL (ref 2.0–4.4)
TSH: 2.38 u[IU]/mL (ref 0.450–4.500)

## 2023-03-28 ENCOUNTER — Ambulatory Visit: Payer: 59

## 2023-04-10 ENCOUNTER — Encounter: Payer: Self-pay | Admitting: Cardiology

## 2023-09-25 IMAGING — CT CT RENAL STONE PROTOCOL
2 of 12 series · 13 of 46 positions shown, 18 images · non-contrast
Comparison: February 10, 2021

CLINICAL DATA: Flank pain, kidney stone suspected



[Series 3: ap without · axial · non-contrast · 0.94mm/px · z∈[+1031,+1431]mm · 11 of 91 slices shown, 15 images]
[im 6/91  soft-tissue]
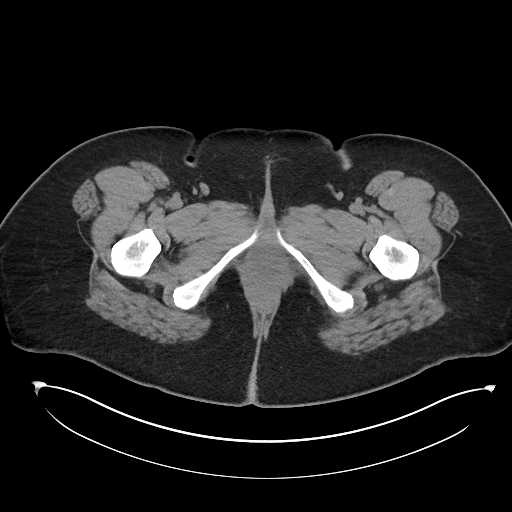
[im 6/91  bone]
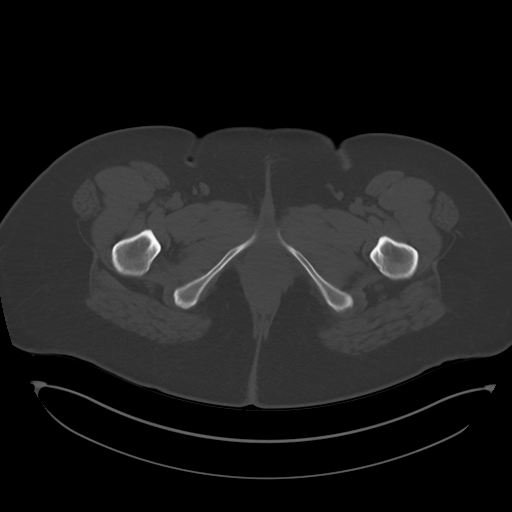
[im 16/91  soft-tissue]
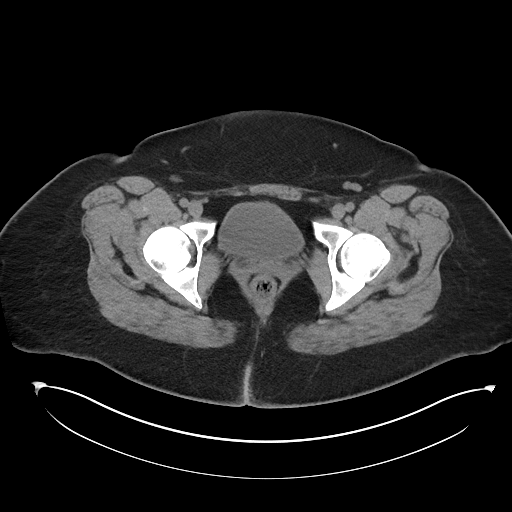
[im 26/91  soft-tissue]
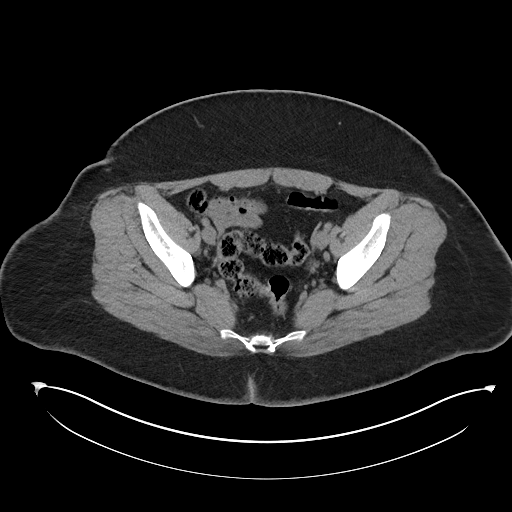
[im 36/91  soft-tissue]
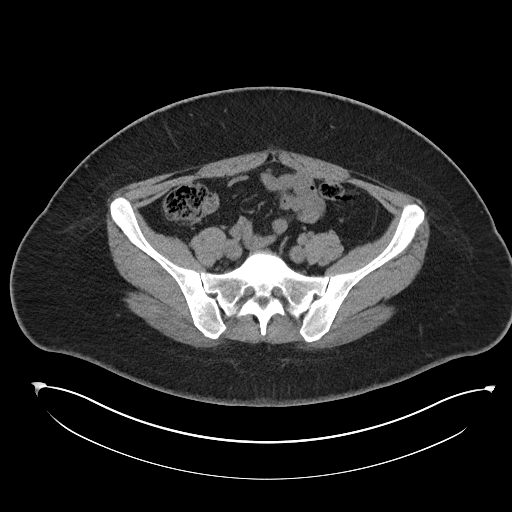
[im 46/91  soft-tissue]
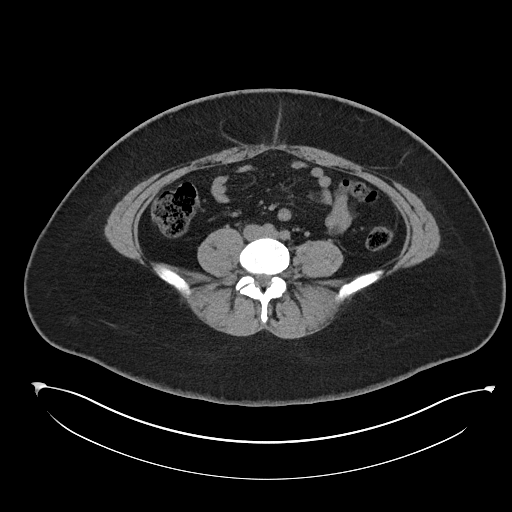
[im 56/91  soft-tissue]
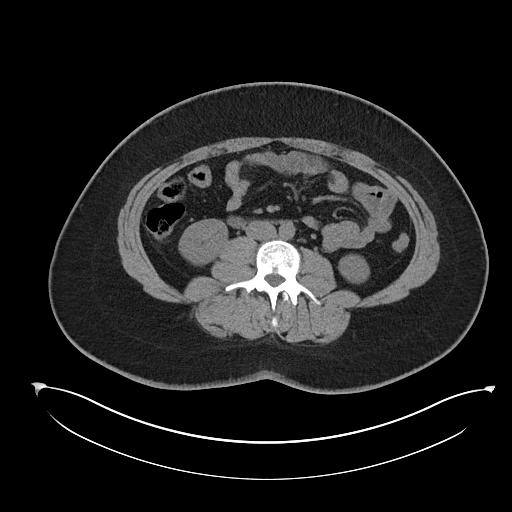
[im 66/91  soft-tissue]
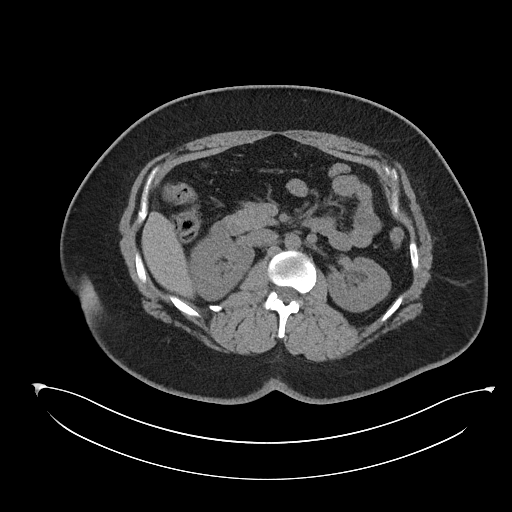
[im 71/91  lung]
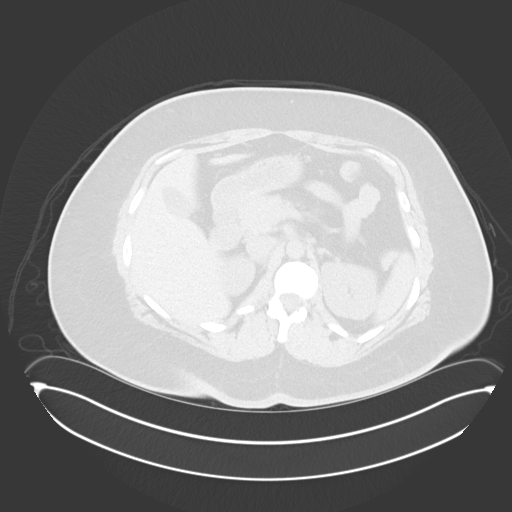
[im 76/91  soft-tissue]
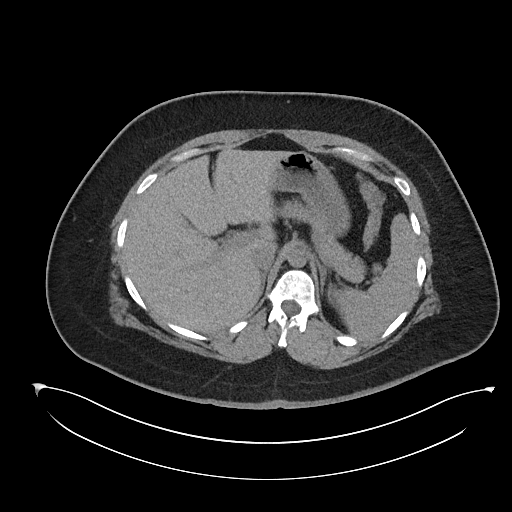
[im 76/91  lung]
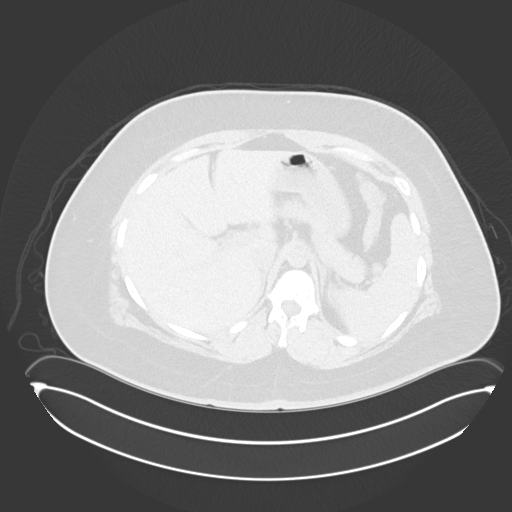
[im 81/91  lung]
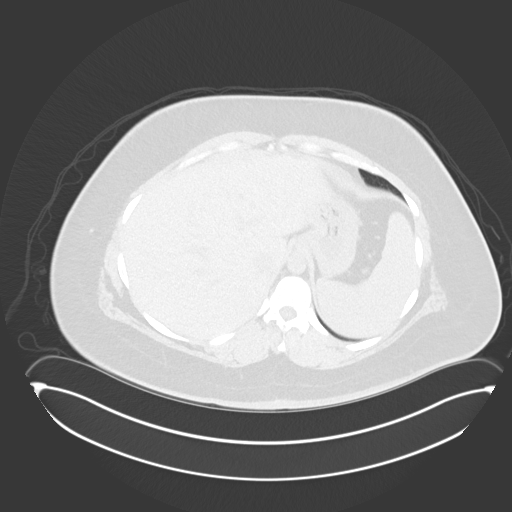
[im 86/91  soft-tissue]
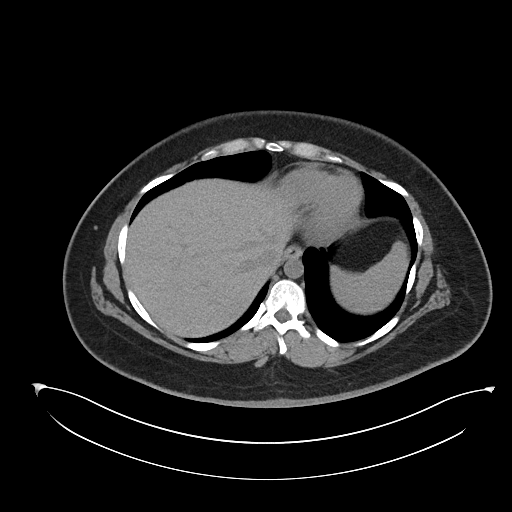
[im 86/91  lung]
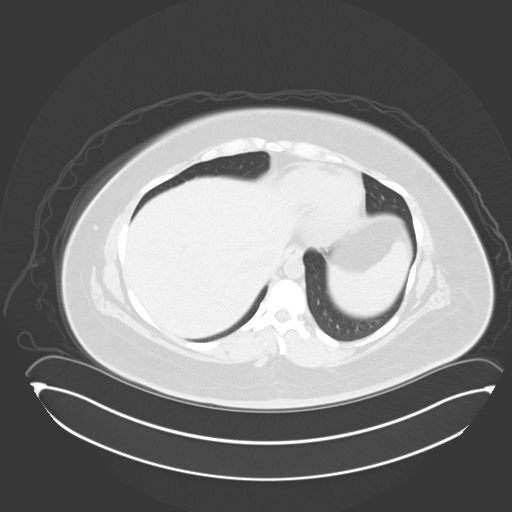
[im 86/91  bone]
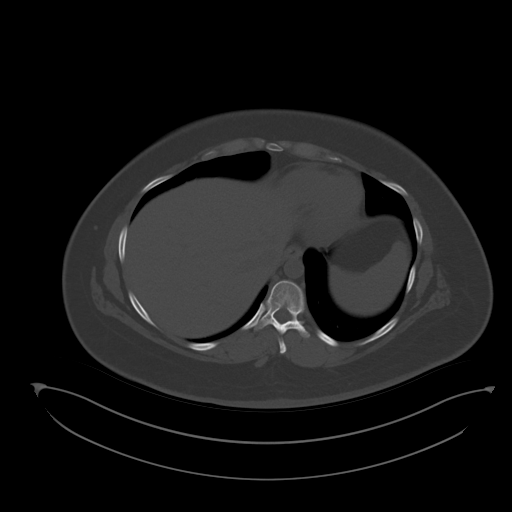

[Series 8: cor · coronal · 0.75mm/px · 2 of 104 slices shown, 3 images]
[im 35/104  soft-tissue]
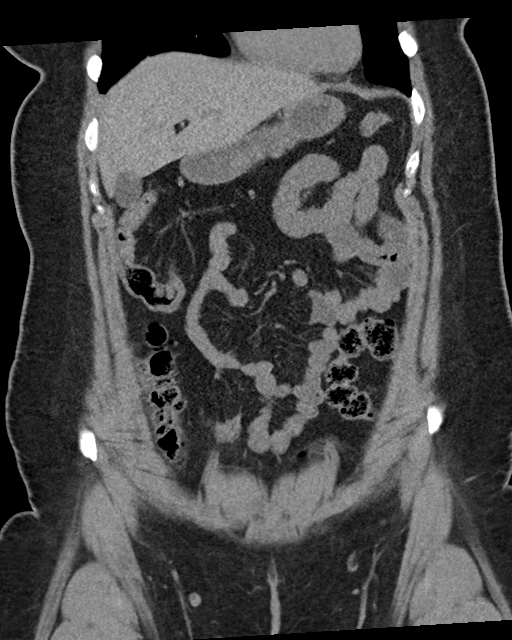
[im 35/104  bone]
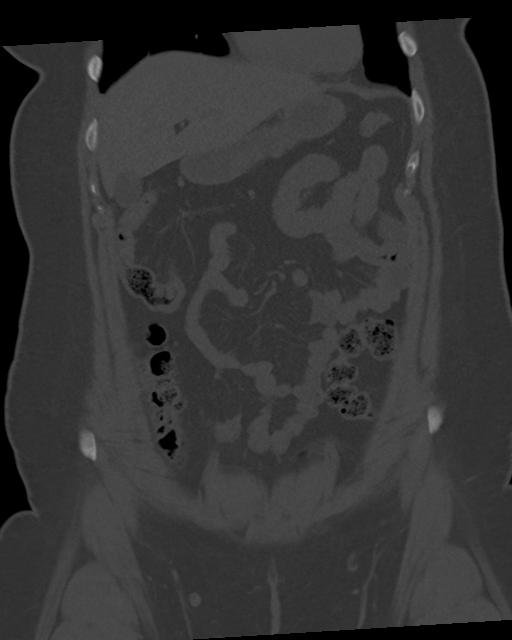
[im 69/104  soft-tissue]
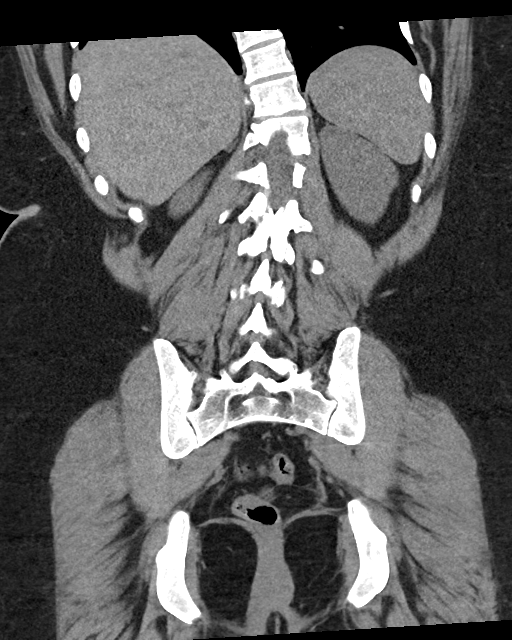

[13 of 46 positions shown; findings below may reference images not displayed]

FINDINGS: Lower chest: No acute abnormality.

Hepatobiliary: No focal liver abnormality is seen. No gallstones,
gallbladder wall thickening, or biliary dilatation.

Pancreas: Unremarkable. No pancreatic ductal dilatation or
surrounding inflammatory changes.

Spleen: Normal in size without focal abnormality.

Adrenals/Urinary Tract: Adrenal glands are unremarkable. Kidneys are
normal, without renal calculi, focal lesion, or hydronephrosis.
Bladder is unremarkable.

Stomach/Bowel: Stomach is within normal limits. Appendix appears
normal. No evidence of bowel wall thickening, distention, or
inflammatory changes.

Vascular/Lymphatic: No significant vascular findings are present. No
enlarged abdominal or pelvic lymph nodes.

Reproductive: Uterus and bilateral adnexa are unremarkable.

Other: No abdominal wall hernia or abnormality. No abdominopelvic
ascites.

Musculoskeletal: No acute or significant osseous findings.
IMPRESSION: Unremarkable noncontrast CT of the abdomen pelvis. No evidence of
renal calculi.

## 2024-01-10 ENCOUNTER — Ambulatory Visit (HOSPITAL_BASED_OUTPATIENT_CLINIC_OR_DEPARTMENT_OTHER)
Admission: EM | Admit: 2024-01-10 | Discharge: 2024-01-10 | Disposition: A | Discharge status: Home / Self Care | Payer: MEDICAID

## 2024-01-10 ENCOUNTER — Encounter (HOSPITAL_BASED_OUTPATIENT_CLINIC_OR_DEPARTMENT_OTHER): Payer: Self-pay

## 2024-01-10 ENCOUNTER — Other Ambulatory Visit (HOSPITAL_BASED_OUTPATIENT_CLINIC_OR_DEPARTMENT_OTHER): Payer: Self-pay

## 2024-01-10 DIAGNOSIS — K5900 Constipation, unspecified: Secondary | ICD-10-CM

## 2024-01-10 DIAGNOSIS — K581 Irritable bowel syndrome with constipation: Secondary | ICD-10-CM | POA: Diagnosis not present

## 2024-01-10 MED ORDER — POLYETHYLENE GLYCOL 3350 17 GM/SCOOP PO POWD
ORAL | 0 refills | Status: AC
  Filled 2024-01-10: qty 510, 15d supply, fill #0

## 2024-01-10 NOTE — ED Provider Notes (Signed)
 [facility]    CSN: [id number] Arrival date & time: 01/10/24  1200      History   Chief Complaint Chief Complaint  Patient presents with   Constipation    HPI Tamara Alvarez is a 27 y.o. female.   28 year old female who reports that she has IBS-C and at one point was on Linzess intermittently for severe symptoms.  She has not seen gastroenterology in several years.  She has been having increased issues with constipation for the last 1-2 weeks.  She is currently taking Tirzepatide(Zepbound) which makes the constipation worst. She does take fiber gummies.  She denies fever, nausea, vomiting, diarrhea.  She is reporting a bowel movement daily or every other day.  She does sometimes have to strain or grunt and she does not always feel like she got empty.  She says that of course she had a huge bowel movement in the bathroom in the waiting room while she was waiting to be seen and feels much better.   Constipation Associated symptoms: no abdominal pain, no back pain, no diarrhea, no dysuria, no fever, no nausea and no vomiting     Past Medical History:  Diagnosis Date   Anxiety disorder    Asthma    Atypical chest pain 06/27/2017   Chest wall pain 06/22/2017   Dyslipidemia 06/27/2017   Hypertension 06/22/2017   Irregular menstruation    Major depressive disorder, single episode, unspecified    Other intervertebral disc displacement, lumbar region    Palpitations 06/27/2017   Polycystic ovarian syndrome    Tachycardia 06/22/2017    Patient Active Problem List   Diagnosis Date Noted   Asthma    Palpitations 06/27/2017   Atypical chest pain 06/27/2017   Dyslipidemia 06/27/2017   Tachycardia 06/22/2017   Chest wall pain 06/22/2017   Hypertension 06/22/2017    Past Surgical History:  Procedure Laterality Date   TONSILLECTOMY AND ADENOIDECTOMY      OB History   No obstetric history on file.      Home Medications    Prior to Admission medications    Medication Sig Start Date End Date Taking? Authorizing Provider  gabapentin (NEURONTIN) 300 MG capsule Take 300 mg by mouth 3 (three) times daily. 11/29/23  Yes [provider]  polyethylene glycol powder (GLYCOLAX/MIRALAX) 17 GM/SCOOP powder Mix 1 capful of MiraLAX powder in 8 ounces of clear liquids and take once or twice daily as needed to get empty of stool.  Once empty of stool could take half to 1 capful of MiraLAX powder in 8 ounces of clear liquids daily to keep bowels more regular. 01/10/24  Yes Ival Domino, FNP  Collagen-Vitamin C-Biotin (COLLAGEN PO) Take 2 tablets by mouth daily.    [provider]  FIBER GUMMIES PO Take 1 tablet by mouth 2 (two) times daily.    [provider]  ibuprofen  (ADVIL ) 800 MG tablet Take 800 mg by mouth 2 (two) times daily as needed for mild pain (pain score 1-3) or moderate pain (pain score 4-6).    [provider]  methocarbamol (ROBAXIN) 500 MG tablet Take 500 mg by mouth 2 (two) times daily.    [provider]  Multiple Vitamin (MULTIVITAMIN) tablet Take 1 tablet by mouth daily.    [provider]  tirzepatide (ZEPBOUND) 2.5 MG/0.5ML Pen Inject 2.5 mg into the skin once a week.    [provider]    Family History Family History  Problem Relation Age of Onset  COPD Mother    Hypertension Father    Heart disease Father    Spina bifida Sister    CAD Maternal Grandmother    Diabetes Maternal Grandmother    Colon cancer Maternal Grandmother    Ovarian cancer Maternal Grandmother    Lung cancer Maternal Grandfather    Breast cancer Paternal Grandmother    Diabetes Paternal Grandmother    Heart disease Paternal Grandfather    Heart Problems Maternal Uncle    Spina bifida Other     Social History Social History   Tobacco Use   Smoking status: Never   Smokeless tobacco: Never  Vaping Use   Vaping status: Never Used  Substance Use Topics   Alcohol use: Yes    Comment:  occasionally   Drug use: Not Currently     Allergies   Hydrocodone, Other, Tomato, and Doxycycline   Review of Systems Review of Systems  Constitutional:  Negative for chills and fever.  HENT:  Negative for ear pain and sore throat.   Eyes:  Negative for pain and visual disturbance.  Respiratory:  Negative for cough and shortness of breath.   Cardiovascular:  Negative for chest pain and palpitations.  Gastrointestinal:  Positive for constipation. Negative for abdominal pain, diarrhea, nausea and vomiting.  Genitourinary:  Negative for dysuria and hematuria.  Musculoskeletal:  Negative for arthralgias and back pain.  Skin:  Negative for color change and rash.  Neurological:  Negative for seizures and syncope.  All other systems reviewed and are negative.    Physical Exam Triage Vital Signs ED Triage Vitals  Encounter Vitals Group     BP 01/10/24 1346 (!) 130/94     Girls Systolic BP Percentile --      Girls Diastolic BP Percentile --      Boys Systolic BP Percentile --      Boys Diastolic BP Percentile --      Pulse Rate 01/10/24 1346 70     Resp 01/10/24 1346 20     Temp 01/10/24 1346 98.4 F (36.9 C)     Temp Source 01/10/24 1346 Oral     SpO2 01/10/24 1346 98 %     Weight --      Height --      Head Circumference --      Peak Flow --      Pain Score 01/10/24 1341 0     Pain Loc --      Pain Education --      Exclude from Growth Chart --    No data found.  Updated Vital Signs BP (!) 130/94 (BP Location: Right Arm)   Pulse 70   Temp 98.4 F (36.9 C) (Oral)   Resp 20   LMP 01/09/2024 (Approximate)   SpO2 98%   Visual Acuity Right Eye Distance:   Left Eye Distance:   Bilateral Distance:    Right Eye Near:   Left Eye Near:    Bilateral Near:     Physical Exam Vitals and nursing note reviewed.  Constitutional:      General: She is not in acute distress.    Appearance: She is well-developed. She is not ill-appearing or toxic-appearing.  HENT:      Head: Normocephalic and atraumatic.     Right Ear: Hearing, tympanic membrane, ear canal and external ear normal.     Left Ear: Hearing, tympanic membrane, ear canal and external ear normal.     Nose: No congestion or rhinorrhea.  Right Sinus: No maxillary sinus tenderness or frontal sinus tenderness.     Left Sinus: No maxillary sinus tenderness or frontal sinus tenderness.     Mouth/Throat:     Lips: Pink.     Mouth: Mucous membranes are moist.     Pharynx: Uvula midline. No oropharyngeal exudate or posterior oropharyngeal erythema.     Tonsils: No tonsillar exudate.  Eyes:     Conjunctiva/sclera: Conjunctivae normal.     Pupils: Pupils are equal, round, and reactive to light.  Cardiovascular:     Rate and Rhythm: Normal rate and regular rhythm.     Heart sounds: S1 normal and S2 normal. No murmur heard. Pulmonary:     Effort: Pulmonary effort is normal. No respiratory distress.     Breath sounds: Normal breath sounds. No decreased breath sounds, wheezing, rhonchi or rales.  Abdominal:     General: Bowel sounds are normal.     Palpations: Abdomen is soft.     Tenderness: There is no abdominal tenderness. There is no right CVA tenderness, left CVA tenderness, guarding or rebound. Negative signs include Murphy's sign, Rovsing's sign and McBurney's sign.  Musculoskeletal:        General: No swelling.     Cervical back: Neck supple.  Lymphadenopathy:     Head:     Right side of head: No submental, submandibular, tonsillar, preauricular or posterior auricular adenopathy.     Left side of head: No submental, submandibular, tonsillar, preauricular or posterior auricular adenopathy.     Cervical: No cervical adenopathy.     Right cervical: No superficial cervical adenopathy.    Left cervical: No superficial cervical adenopathy.  Skin:    General: Skin is warm and dry.     Capillary Refill: Capillary refill takes less than 2 seconds.     Findings: No rash.  Neurological:     Mental  Status: She is alert and oriented to person, place, and time.  Psychiatric:        Mood and Affect: Mood normal.      UC Treatments / Results  Labs (all labs ordered are listed, but only abnormal results are displayed) Labs Reviewed - No data to display  EKG   Radiology No results found.  Procedures Procedures (including critical care time)  Medications Ordered in UC Medications - No data to display  Initial Impression / Assessment and Plan / UC Course  I have reviewed the triage vital signs and the nursing notes.  Pertinent labs & imaging results that were available during my care of the patient were reviewed by me and considered in my medical decision making (see chart for details).  Plan of Care: Constipation and bowel syndrome: Encouraged to use magnesium citrate to get empty of stool, see discharge instructions provided the constipation recipe to help promote more regular bowel movements, see discharge instructions encouraged MiraLAX, 1 capful once or twice daily in 8 ounces of liquid for constipation if needed.  Discussed possible abdominal x-ray to further evaluate possible constipation or obstruction.  She is having regular bowel movements is just hard and less frequent.  At this point I do not think there would be a big benefit to the abdominal x-ray but I did offer it and she declined.  Follow-up here with primary care if symptoms do not improve, worsen or new symptoms occur.  I reviewed the plan of care with the patient and/or the patient's guardian.  The patient and/or guardian had time to ask questions and  acknowledged that the questions were answered.  I provided instruction on symptoms or reasons to return here or to go to an ER, if symptoms/condition did not improve, worsened or if new symptoms occurred.  Final Clinical Impressions(s) / UC Diagnoses   Final diagnoses:  Constipation, unspecified constipation type  Irritable bowel syndrome with constipation      Discharge Instructions      Constipation and bowel syndrome: Encouraged to use magnesium citrate to get empty of stool, see below.  Provided the constipation recipe to help promote more regular bowel movements, see below.  Encouraged MiraLAX, 1 capful once or twice daily in 8 ounces of liquid for constipation if needed.  Follow-up here with primary care if symptoms do not improve, worsen or new symptoms occur.  Magnesium Citrate Bowel Cleanse Night Before Bowel Cleanse:  Chill a Bottle of Magnesium Citrate overnight. Day of the Bowel Cleanse:  At 7:00 am, drink half the bottle of Magnesium Citrate. At 7:00 am, drink 8-12 ounces of a clear liquid. At 8:00 am, drink 8-12 ounces of a clear liquid. At 9:00 am, drink 8-12 ounces of a clear liquid. At 10:00 am, drink 8-12 ounces of a clear liquid. At 11:00 am, drink 8-12 ounces of a clear liquid. At 12:00 pm, drink the other half the bottle of Magnesium Citrate. At 12:00 pm, drink 8-12 ounces of a clear liquid. At 1:00 pm, drink 8-12 ounces of a clear liquid. At 2:00 pm, drink 8-12 ounces of a clear liquid. At 3:00 pm, drink 8-12 ounces of a clear liquid. At 4:00 pm, drink 8-12 ounces of a clear liquid. At 5:00 pm, drink 8-12 ounces of a clear liquid. Day after the Bowel Cleanse:  The next day, take Miralax, 1 capful mixed in 8 ounces of liquids, twice (morning and afternoon). Drink lots of liquids all day long.  Constipation Recipe:  Mix together: ? 1 cup apple sauce ? 1 cup oat bran or unprocessed wheat bran ?  cup prune juice This recipe helps to increase dietary fiber intake and promotes regular bowel function. You may have a bloated feeling and more gas when adding fiber to your diet, but this should pass in a few weeks. Begin with 1-2 tablespoons each evening mixed with or followed by one 6-8 ounce cup of water or juice. After two weeks you will have softer and more regular bowel movements. If no change occurs, slowly  increase the amount to 3-4 tablespoons. Plan to make this part of your daily routine for the rest of your lifetime.  You can store the mixture in your refrigerator or freezer. You can also freeze 1-2 tablespoon servings in sectioned ice cube trays or in foam plastic egg cartons and thaw as needed.      ED Prescriptions     Medication Sig Dispense Auth. Provider   polyethylene glycol powder (GLYCOLAX/MIRALAX) 17 GM/SCOOP powder Mix 1 capful of MiraLAX powder in 8 ounces of clear liquids and take once or twice daily as needed to get empty of stool.  Once empty of stool could take half to 1 capful of MiraLAX powder in 8 ounces of clear liquids daily to keep bowels more regular. 510 g Ival Domino, FNP      PDMP not reviewed this encounter.   Ival Domino, FNP 01/10/24 [phone]

## 2024-01-10 NOTE — ED Triage Notes (Signed)
 Pt states she has been having increased issues with constipation for the last 1-2 weeks. Pt states she has IBS and is currently taking ozempic which makes the constipation worst. She does take fiber gummies and has had to take linzess for her constipation in the past. Pt denies abdominal pain.

## 2024-01-10 NOTE — Discharge Instructions (Addendum)
 Constipation and bowel syndrome: Encouraged to use magnesium citrate to get empty of stool, see below.  Provided the constipation recipe to help promote more regular bowel movements, see below.  Encouraged MiraLAX, 1 capful once or twice daily in 8 ounces of liquid for constipation if needed.  Follow-up here with primary care if symptoms do not improve, worsen or new symptoms occur.  Magnesium Citrate Bowel Cleanse Night Before Bowel Cleanse:  Chill a Bottle of Magnesium Citrate overnight. Day of the Bowel Cleanse:  At 7:00 am, drink half the bottle of Magnesium Citrate. At 7:00 am, drink 8-12 ounces of a clear liquid. At 8:00 am, drink 8-12 ounces of a clear liquid. At 9:00 am, drink 8-12 ounces of a clear liquid. At 10:00 am, drink 8-12 ounces of a clear liquid. At 11:00 am, drink 8-12 ounces of a clear liquid. At 12:00 pm, drink the other half the bottle of Magnesium Citrate. At 12:00 pm, drink 8-12 ounces of a clear liquid. At 1:00 pm, drink 8-12 ounces of a clear liquid. At 2:00 pm, drink 8-12 ounces of a clear liquid. At 3:00 pm, drink 8-12 ounces of a clear liquid. At 4:00 pm, drink 8-12 ounces of a clear liquid. At 5:00 pm, drink 8-12 ounces of a clear liquid. Day after the Bowel Cleanse:  The next day, take Miralax, 1 capful mixed in 8 ounces of liquids, twice (morning and afternoon). Drink lots of liquids all day long.  Constipation Recipe:  Mix together: ? 1 cup apple sauce ? 1 cup oat bran or unprocessed wheat bran ?  cup prune juice This recipe helps to increase dietary fiber intake and promotes regular bowel function. You may have a bloated feeling and more gas when adding fiber to your diet, but this should pass in a few weeks. Begin with 1-2 tablespoons each evening mixed with or followed by one 6-8 ounce cup of water or juice. After two weeks you will have softer and more regular bowel movements. If no change occurs, slowly increase the amount to 3-4 tablespoons. Plan  to make this part of your daily routine for the rest of your lifetime.  You can store the mixture in your refrigerator or freezer. You can also freeze 1-2 tablespoon servings in sectioned ice cube trays or in foam plastic egg cartons and thaw as needed.

## 2024-01-28 ENCOUNTER — Other Ambulatory Visit (HOSPITAL_BASED_OUTPATIENT_CLINIC_OR_DEPARTMENT_OTHER): Payer: Self-pay

## 2024-02-05 ENCOUNTER — Encounter (HOSPITAL_BASED_OUTPATIENT_CLINIC_OR_DEPARTMENT_OTHER): Payer: Self-pay

## 2024-02-05 ENCOUNTER — Other Ambulatory Visit (HOSPITAL_BASED_OUTPATIENT_CLINIC_OR_DEPARTMENT_OTHER): Payer: Self-pay

## 2024-02-05 ENCOUNTER — Inpatient Hospital Stay (HOSPITAL_BASED_OUTPATIENT_CLINIC_OR_DEPARTMENT_OTHER)
Admission: RE | Admit: 2024-02-05 | Discharge: 2024-02-05 | Payer: MEDICAID | Attending: Family Medicine | Admitting: Family Medicine

## 2024-02-05 VITALS — BP 96/62 | HR 90 | Temp 98.6°F | Resp 18

## 2024-02-05 DIAGNOSIS — R112 Nausea with vomiting, unspecified: Secondary | ICD-10-CM

## 2024-02-05 DIAGNOSIS — R509 Fever, unspecified: Secondary | ICD-10-CM

## 2024-02-05 DIAGNOSIS — R197 Diarrhea, unspecified: Secondary | ICD-10-CM

## 2024-02-05 LAB — POC COVID19/FLU A&B COMBO
Covid Antigen, POC: NEGATIVE
Influenza A Antigen, POC: NEGATIVE
Influenza B Antigen, POC: NEGATIVE

## 2024-02-05 MED ORDER — ONDANSETRON 4 MG PO TBDP
4.0000 mg | ORAL_TABLET | Freq: Once | ORAL | Status: AC
  Administered 2024-02-05: 4 mg via ORAL

## 2024-02-05 MED ORDER — ONDANSETRON 4 MG PO TBDP
4.0000 mg | ORAL_TABLET | Freq: Three times a day (TID) | ORAL | 0 refills | Status: AC | PRN
  Filled 2024-02-05: qty 20, 7d supply, fill #0

## 2024-02-05 NOTE — ED Triage Notes (Signed)
 Pt c/o vomiting diarrhea cold sweats fever of 101 all started last night. Denies cough ear pain. Has taken tylenol and Tums for symptoms. 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

## 2024-02-05 NOTE — ED Provider Notes (Signed)
 [facility]    CSN: [id number] Arrival date & time: 02/05/24  0818      History   Chief Complaint Chief Complaint  Patient presents with   Nausea    Entered by patient    HPI Tamara Alvarez is a 28 y.o. female.   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  On the night of 02/03/2024, she developed acute onset nausea, vomiting, diarrhea and hot and cold chills with a fever of 101.0 oral.  During the night she had diarrhea that was liquid 6 times.  The last time she had diarrhea was at approximately 7:30 AM.  She also vomited 3 times during the night and she vomited again at approximately 7 AM this morning.  She has not had anything to eat or drink since she started vomiting in the middle of the night.  She denies cough, ear pain, nasal congestion, constipation.  She is still nauseous.  She has tried to get some Tylenol and thinks that she kept it down.  She has also taken Tums for her upset stomach.  Many children at her daycare/school have had similar symptoms.     Past Medical History:  Diagnosis Date   Anxiety disorder    Asthma    Atypical chest pain 06/27/2017   Chest wall pain 06/22/2017   Dyslipidemia 06/27/2017   Hypertension 06/22/2017   Irregular menstruation    Major depressive disorder, single episode, unspecified    Other intervertebral disc displacement, lumbar region    Palpitations 06/27/2017   Polycystic ovarian syndrome    Tachycardia 06/22/2017    Patient Active Problem List   Diagnosis Date Noted   Asthma    Palpitations 06/27/2017   Atypical chest pain 06/27/2017   Dyslipidemia 06/27/2017   Tachycardia 06/22/2017   Chest wall pain 06/22/2017   Hypertension 06/22/2017    Past Surgical History:  Procedure Laterality Date   TONSILLECTOMY AND ADENOIDECTOMY      OB History   No obstetric history on file.      Home Medications    Prior to Admission medications   Medication Sig Start Date End Date Taking? Authorizing  Provider  Collagen-Vitamin C-Biotin (COLLAGEN PO) Take 2 tablets by mouth daily.   Yes [provider]  FIBER GUMMIES PO Take 1 tablet by mouth 2 (two) times daily.   Yes [provider]  gabapentin (NEURONTIN) 300 MG capsule Take 300 mg by mouth 3 (three) times daily. 11/29/23  Yes [provider]  Multiple Vitamin (MULTIVITAMIN) tablet Take 1 tablet by mouth daily.   Yes [provider]  tirzepatide (ZEPBOUND) 2.5 MG/0.5ML Pen Inject 2.5 mg into the skin once a week.   Yes [provider]  ibuprofen  (ADVIL ) 800 MG tablet Take 800 mg by mouth 2 (two) times daily as needed for mild pain (pain score 1-3) or moderate pain (pain score 4-6).    [provider]  methocarbamol (ROBAXIN) 500 MG tablet Take 500 mg by mouth 2 (two) times daily.    [provider]  polyethylene glycol powder (GLYCOLAX /MIRALAX ) 17 GM/SCOOP powder Mix 1 capful of MiraLAX  powder in 8 ounces of clear liquids and take once or twice daily as needed to get empty of stool.  Once empty of stool could take half to 1 capful of MiraLAX  powder in 8 ounces of clear liquids daily to keep bowels more regular. 01/10/24   Ival Domino, FNP    Family History Family History  Problem Relation Age  of Onset   COPD Mother    Hypertension Father    Heart disease Father    Spina bifida Sister    CAD Maternal Grandmother    Diabetes Maternal Grandmother    Colon cancer Maternal Grandmother    Ovarian cancer Maternal Grandmother    Lung cancer Maternal Grandfather    Breast cancer Paternal Grandmother    Diabetes Paternal Grandmother    Heart disease Paternal Grandfather    Heart Problems Maternal Uncle    Spina bifida Other     Social History Social History   Tobacco Use   Smoking status: Never   Smokeless tobacco: Never  Vaping Use   Vaping status: Never Used  Substance Use Topics   Alcohol use: Yes    Comment: occasionally   Drug use: Not Currently      Allergies   Hydrocodone, Other, Tomato, and Doxycycline   Review of Systems Review of Systems  Constitutional:  Positive for chills and fever.  HENT:  Negative for ear pain and sore throat.   Eyes:  Negative for pain and visual disturbance.  Respiratory:  Negative for cough and shortness of breath.   Cardiovascular:  Negative for chest pain and palpitations.  Gastrointestinal:  Positive for abdominal pain, diarrhea, nausea and vomiting. Negative for constipation.  Genitourinary:  Negative for dysuria and hematuria.  Musculoskeletal:  Negative for arthralgias and back pain.  Skin:  Negative for color change and rash.  Neurological:  Negative for seizures and syncope.  All other systems reviewed and are negative.    Physical Exam Triage Vital Signs ED Triage Vitals  Encounter Vitals Group     BP 02/05/24 0836 96/62     Girls Systolic BP Percentile --      Girls Diastolic BP Percentile --      Boys Systolic BP Percentile --      Boys Diastolic BP Percentile --      Pulse Rate 02/05/24 0836 (!) 102     Resp 02/05/24 0836 18     Temp 02/05/24 0836 98.6 F (37 C)     Temp Source 02/05/24 0836 Oral     SpO2 02/05/24 0836 93 %     Weight --      Height --      Head Circumference --      Peak Flow --      Pain Score 02/05/24 0833 0     Pain Loc --      Pain Education --      Exclude from Growth Chart --    No data found.  Updated Vital Signs BP 96/62 (BP Location: Right Arm)   Pulse 90   Temp 98.6 F (37 C) (Oral)   Resp 18   LMP 01/09/2024 (Approximate)   SpO2 100%   Visual Acuity Right Eye Distance:   Left Eye Distance:   Bilateral Distance:    Right Eye Near:   Left Eye Near:    Bilateral Near:     Physical Exam Vitals and nursing note reviewed.  Constitutional:      General: She is not in acute distress.    Appearance: She is well-developed. She is ill-appearing. She is not toxic-appearing or diaphoretic.  HENT:     Head: Normocephalic and  atraumatic.     Right Ear: Hearing, tympanic membrane, ear canal and external ear normal.     Left Ear: Hearing, tympanic membrane, ear canal and external ear normal.     Nose: Mucosal  edema, congestion and rhinorrhea present. Rhinorrhea is clear.     Right Sinus: No maxillary sinus tenderness or frontal sinus tenderness.     Left Sinus: No maxillary sinus tenderness or frontal sinus tenderness.     Mouth/Throat:     Lips: Pink.     Mouth: Mucous membranes are moist.     Pharynx: Uvula midline. No oropharyngeal exudate or posterior oropharyngeal erythema.     Tonsils: No tonsillar exudate.  Eyes:     Conjunctiva/sclera: Conjunctivae normal.     Pupils: Pupils are equal, round, and reactive to light.  Cardiovascular:     Rate and Rhythm: Normal rate and regular rhythm.     Heart sounds: S1 normal and S2 normal. No murmur heard. Pulmonary:     Effort: Pulmonary effort is normal. No respiratory distress.     Breath sounds: Normal breath sounds. No decreased breath sounds, wheezing, rhonchi or rales.  Abdominal:     General: Bowel sounds are normal.     Palpations: Abdomen is soft.     Tenderness: There is abdominal tenderness (Mild and intermittent) in the right upper quadrant, right lower quadrant, left upper quadrant and left lower quadrant. There is no right CVA tenderness, left CVA tenderness, guarding or rebound. Negative signs include Murphy's sign, Rovsing's sign and McBurney's sign.  Musculoskeletal:        General: No swelling.     Cervical back: Neck supple.  Lymphadenopathy:     Head:     Right side of head: No submental, submandibular, tonsillar, preauricular or posterior auricular adenopathy.     Left side of head: No submental, submandibular, tonsillar, preauricular or posterior auricular adenopathy.     Cervical: No cervical adenopathy.     Right cervical: No superficial cervical adenopathy.    Left cervical: No superficial cervical adenopathy.  Skin:    General: Skin  is warm and dry.     Capillary Refill: Capillary refill takes less than 2 seconds.     Findings: No rash.  Neurological:     Mental Status: She is alert and oriented to person, place, and time.  Psychiatric:        Mood and Affect: Mood normal.      UC Treatments / Results  Labs (all labs ordered are listed, but only abnormal results are displayed) Labs Reviewed  POC COVID19/FLU A&B COMBO    EKG   Radiology No results found.  Procedures Procedures (including critical care time)  Medications Ordered in UC Medications  ondansetron  (ZOFRAN -ODT) disintegrating tablet 4 mg (has no administration in time range)    Initial Impression / Assessment and Plan / UC Course  I have reviewed the triage vital signs and the nursing notes.  Pertinent labs & imaging results that were available during my care of the patient were reviewed by me and considered in my medical decision making (see chart for details).  Plan of Care (see discharge instructions for additional patient precautions and education): Fever with nausea, vomiting and diarrhea:  COVID and flu test were negative.   Given ondansetron  during the visit and that has helped her nausea.  Provided ondansetron , 4 mg, melts on tongue, every 8 hours for use at home.   Stop solid food and go to clear liquids today.  Do not eat or drink anything for at least 2 hours from the last time you vomit.  After that add liquids slowly and if you vomit stop and wait again.   Advance the diet as  tolerated.   Provided a handout on rehydration. Work excuse provided.   Follow-up if symptoms do not improve, worsen or new symptoms occur  I reviewed the plan of care with the patient and/or the patient's guardian.  The patient and/or guardian had time to ask questions and acknowledged that the questions were answered.  Final Clinical Impressions(s) / UC Diagnoses   Final diagnoses:  Fever, unspecified  Diarrhea, unspecified type  Nausea and  vomiting, unspecified vomiting type   Discharge Instructions   None    ED Prescriptions   None    PDMP not reviewed this encounter.   Ival Domino, FNP 02/05/24 [phone]

## 2024-02-05 NOTE — Discharge Instructions (Addendum)
 Fever with nausea, vomiting and diarrhea: COVID and flu test were negative.  Given ondansetron  during the visit and that has helped her nausea.  Provided ondansetron , 4 mg, melts on tongue, every 8 hours for use at home.  Stop solid food and go to clear liquids today.  Do not eat or drink anything for at least 2 hours from the last time you vomit.  After that add liquids slowly and if you vomit stop and wait again.  Advance the diet as tolerated.  Provided a handout on rehydration.  Work excuse provided.  Follow-up if symptoms do not improve, worsen or new symptoms occur

## 2024-02-22 ENCOUNTER — Other Ambulatory Visit (HOSPITAL_BASED_OUTPATIENT_CLINIC_OR_DEPARTMENT_OTHER): Payer: Self-pay
